# Patient Record
Sex: Female | Born: 1950 | Hispanic: Yes | Marital: Married | Smoking: Never smoker
Health system: Southern US, Community
[De-identification: ages and names within clinical notes are randomized; demographics above are authoritative.]

## PROBLEM LIST (undated history)

## (undated) DIAGNOSIS — I10 Essential (primary) hypertension: Secondary | ICD-10-CM

## (undated) HISTORY — DX: Essential (primary) hypertension: I10

---

## 2003-09-23 ENCOUNTER — Emergency Department (HOSPITAL_COMMUNITY): Admission: EM | Admit: 2003-09-23 | Discharge: 2003-09-23 | Payer: Self-pay | Admitting: Emergency Medicine

## 2005-10-25 IMAGING — CT CT HEAD W/O CM
1 of 2 series · 13 of 30 positions shown, 17 images · non-contrast
Comparison: none

CLINICAL DATA: Headache.  Hypertension.
 CT OF THE HEAD WITHOUT CONTRAST
 Routine unenhanced study was performed.  No comparison.
 There is no evidence of acute intracranial hemorrhage, mass effect, or extra-axial fluid collection.  The ventricles and subarachnoid spaces are appropriately sized for age.  The left frontal and anterior ethmoid sinuses are opacified.  There is also some mucosal thickening in the left division of the sphenoid sinus.  No sinus expansion or destruction is demonstrated.  Calvarium appears unremarkable.
 IMPRESSION
 1. Left ethmoid and frontal sinus disease.
 2. Unremarkable CT of the brain.

[Series 2: brain · axial · 0.47mm/px · z∈[+176,+306]mm · 13 of 28 slices shown, 17 images]
[im 2/28  brain]
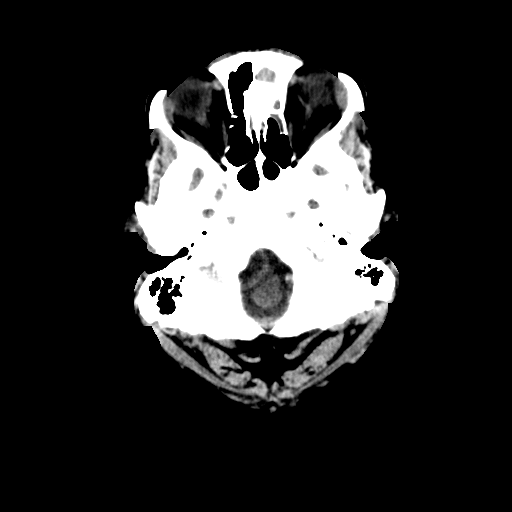
[im 2/28  bone]
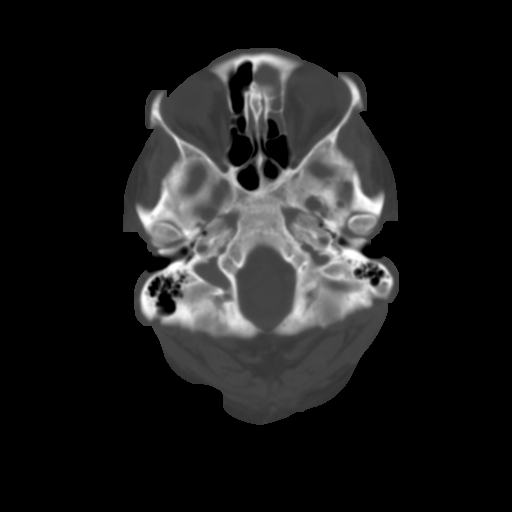
[im 4/28  brain]
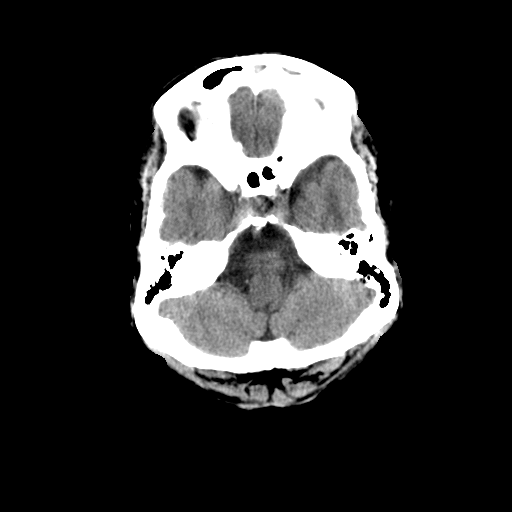
[im 6/28  brain]
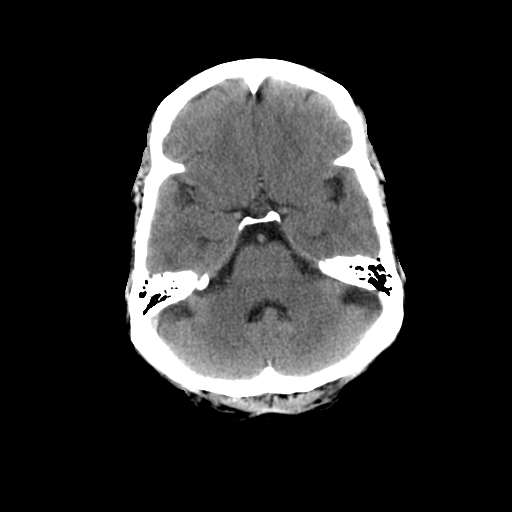
[im 8/28  brain]
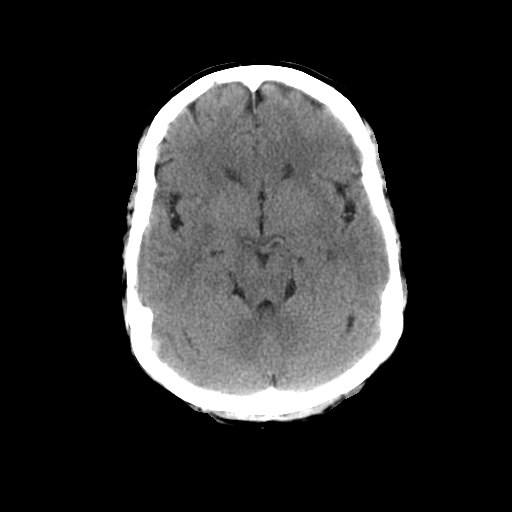
[im 10/28  brain]
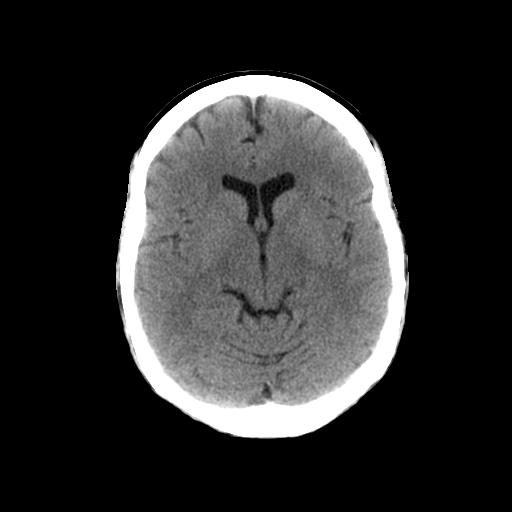
[im 10/28  bone]
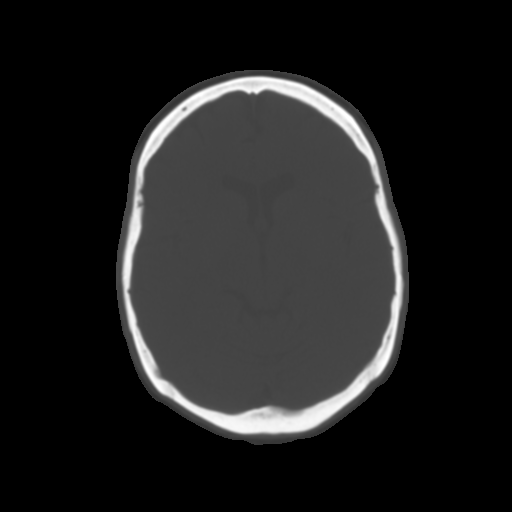
[im 12/28  brain]
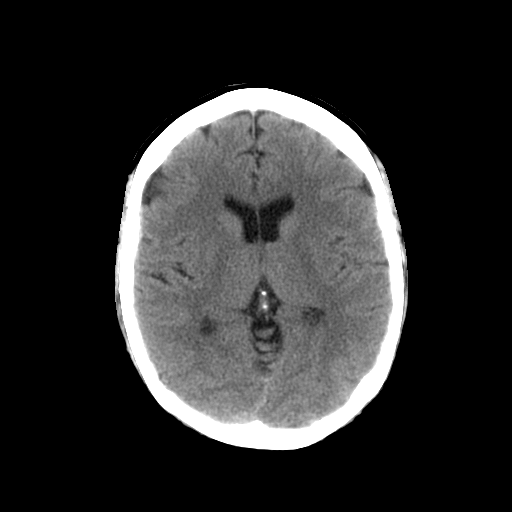
[im 14/28  brain]
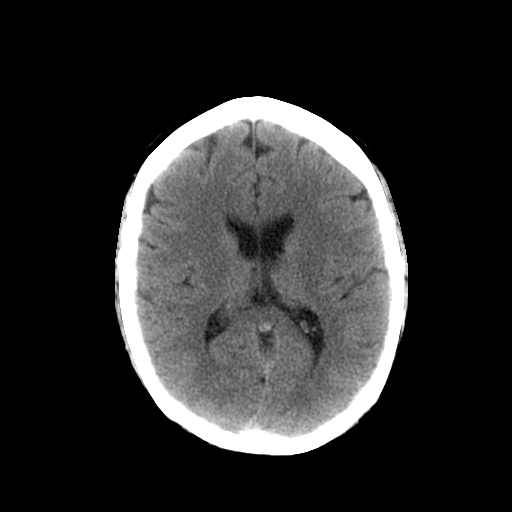
[im 16/28  brain]
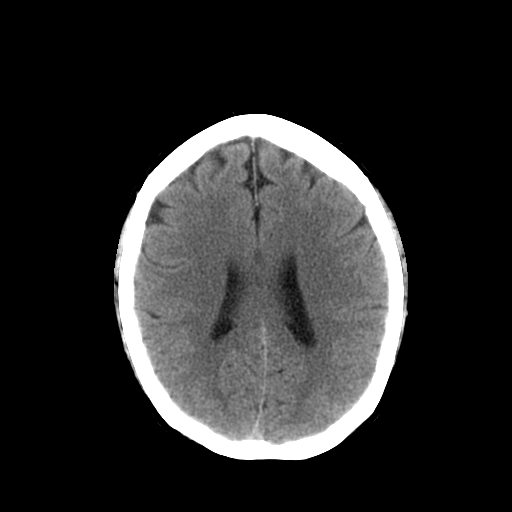
[im 18/28  brain]
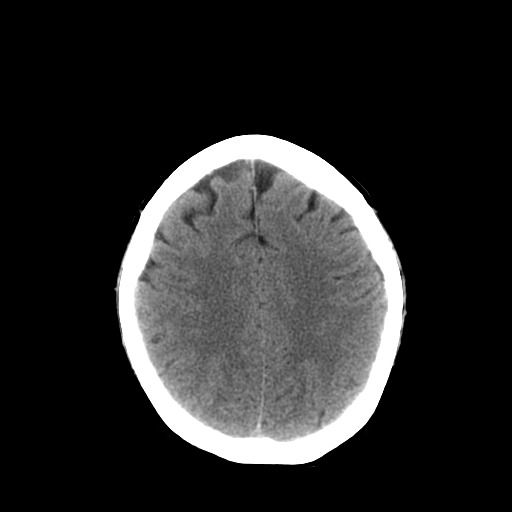
[im 18/28  bone]
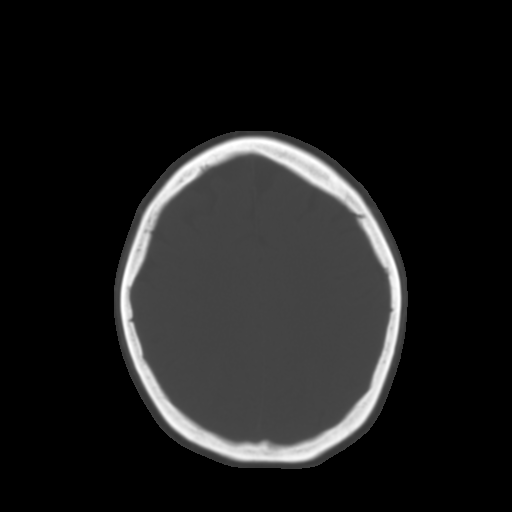
[im 20/28  brain]
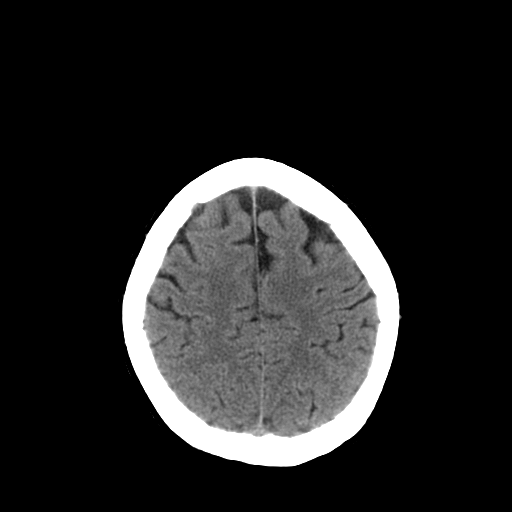
[im 22/28  brain]
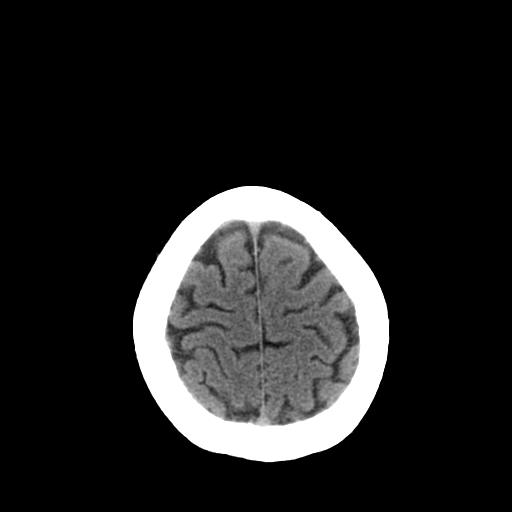
[im 24/28  brain]
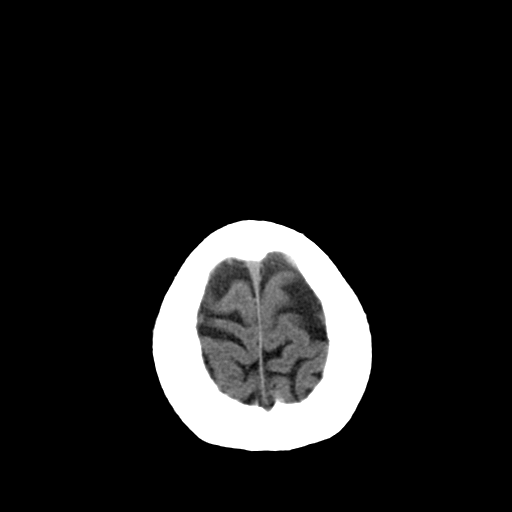
[im 26/28  brain]
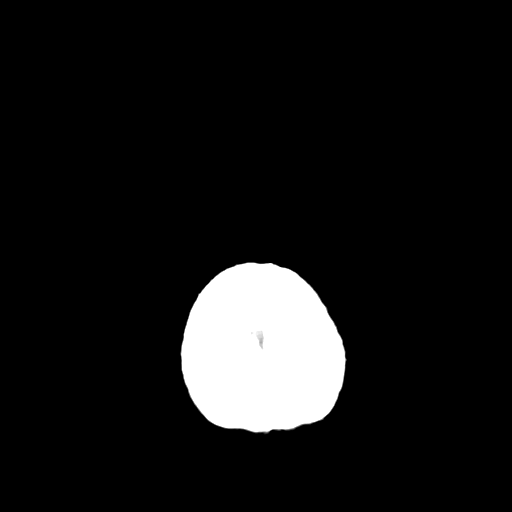
[im 26/28  bone]
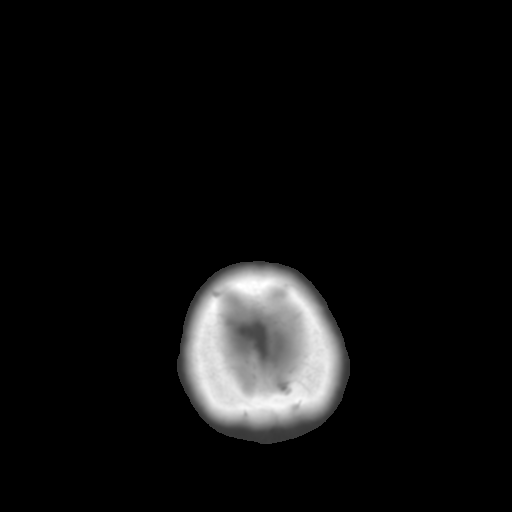

[13 of 30 positions shown; findings below may reference images not displayed]

## 2014-08-08 ENCOUNTER — Ambulatory Visit (INDEPENDENT_AMBULATORY_CARE_PROVIDER_SITE_OTHER): Payer: Self-pay | Admitting: Family Medicine

## 2014-08-08 VITALS — BP 152/64 | HR 45 | Temp 97.5°F | Resp 18 | Ht 58.75 in | Wt 148.8 lb

## 2014-08-08 DIAGNOSIS — I10 Essential (primary) hypertension: Secondary | ICD-10-CM

## 2014-08-08 DIAGNOSIS — M1990 Unspecified osteoarthritis, unspecified site: Secondary | ICD-10-CM

## 2014-08-08 MED ORDER — MELOXICAM 7.5 MG PO TABS: 7.5000 mg | ORAL_TABLET | Freq: Every day | ORAL | Status: AC

## 2014-08-08 MED ORDER — BISOPROLOL-HYDROCHLOROTHIAZIDE 5-6.25 MG PO TABS: 1.0000 | ORAL_TABLET | Freq: Every day | ORAL | Status: AC

## 2014-08-08 NOTE — Progress Notes (Signed)
This a married woman, 64 years old, from [place] who comes in with 1 month of right knee pain. It's given out on her couple times. She is also some pain in her left knee, left flank, and back. She finds that she is walking a little bit differently since she started having knee pain.  Patient's husband was in yesterday and got a shot of cortisone in his knee and is doing much better today.  Objective: Pleasant woman in no acute distress Examination of the joints reveals good range of motion both knees with a small amount of effusion in the right knee and no point tenderness. There is no Baker cyst palpable. She has good range of motion of her shoulders and hips.  After informed consent, the area of the lateral right knee was prepped with Betadine and then 1 mL of Marcaine and 1/2 mL of Depo-Medrol was injected with good pain relief.  This chart was scribed in my presence and reviewed by me personally.    ICD-9-CM ICD-10-CM   1. Arthritis 716.90 M19.90 meloxicam (MOBIC) 7.5 MG tablet  2. Essential hypertension 401.9 I10 bisoprolol-hydrochlorothiazide (ZIAC) 5-6.25 MG per tablet     Signed, Elvina SidleKurt Shamel Germond, MD

## 2014-08-08 NOTE — Patient Instructions (Signed)
Artritis inespecífica °(Arthritis, Nonspecific) °La artritis es la inflamación de una articulación. Los síntomas son dolor, enrojecimiento, calor o hinchazón. Pueden verse involucradas una o más articulaciones. Hay diferentes tipos de artritis. El médico no podrá diagnosticar inmediatamente cuál es el tipo de artritis que usted sufre.  °CAUSAS °La causa más frecuente es el desgaste de la articulación (osteoartritis). Esto ocasiona lesiones en el cartílago, que puede romperse con el tiempo. Las zonas más afectadas por este tipo de artritis son las rodillas, caderas, espalda y cuello. °Otros tipos de artritis y causas frecuentes de dolor en la articulación son: °· Esguinces y otras lesiones cercanas a la articulación}. En algunos casos, esguinces y lesiones menores causan dolor e hinchazón que aparece horas más tarde. °· Artritis reumatoidea Afecta las manos, pies y rodillas. Generalmente afecta ambos lados del cuerpo al mismo tiempo. Generalmente se asocia a enfermedades crónicas, fiebre, pérdida de peso y debilidad general. °· Artritis por cristales. La gota y la pseudogota pueden causar dolor intenso agudo ocasional, enrojecimiento e hinchazón del pie, el tobillo o la rodilla. °· Artritis infecciosa. Las bacterias pueden penetrar en la articulación a través de una herida en la piel. Esto puede causar una infección en la articulación. Las bacterias y virus también pueden diseminarse a través del torrente sanguíneo y afectar las articulaciones. °· Reacciones a medicamentos, infecciosas y alérgicas. En algunos casos las articulaciones duelen levemente y están ligeramente hinchadas en este tipo de enfermedad. °SÍNTOMAS °· El dolor es el síntoma principal. °· La articulación también pueden verse roja, hinchada y caliente al tacto. °· En ciertos tipos de artritis hay fiebre o malestar general. °· En la articulación que presenta artritis sentirá dolor con el movimiento. En otros tipos de artritis hay  rigidez. °DIAGNÓSTICO: °El médico sospechará artritis basándose en la descripción de los síntomas y en el examen. Será necesario realizar pruebas para diagnosticar el tipo de artritis. °· Análisis de sangre y en algunos casos de orina. °· Radiografías y en algunos casos tomografía computada o diagnóstico por imágenes. °· La remoción del líquido de la articulación (artrocentesis) se realiza para controlar la presencia de bacterias, cristales o por otras causas. Su médico (o un especialista) adormecerán la zona de la articulación con un anestésico local y utilizarán una aguja para retirar líquido de la articulación para ser examinado. Este procedimiento es sólo mínimamente molesto. °· Aún con estas pruebas, el médico no podrá decir qué tipo de artritis usted sufre. La consulta con un especialista (reumatólogo) puede ser de utilidad. °TRATAMIENTO °El médico comentará con usted el tratamiento específico para su tipo de artritis. Si el tipo específico no puede determinarse, podrán aplicarse las siguientes recomendaciones generales.  °El tratamiento para el dolor intenso de las articulaciones consiste en: °· Hacer reposo °· Elevar el miembro. °· Podrán prescribirle medicamentos antiinflamatorios (como ibuprofeno). Evite las actividades que aumenten el dolor. °· Sólo tome medicamentos de venta libre o prescriptos para calmar el dolor y las molestias, según las indicaciones de su médico. °· Puede aplicarse compresas frías sobre la articulación dolorida durante 10 a 15 minutos cada hora. Las compresas calientes también pueden ser beneficiosas, pero no las utilice durante la noche. No use compresas calientes sin autorización de su médico, si es diabético. °· Una inyección de corticoides en la articulación artrítica puede ayudar a reducir el dolor y la hinchazón. °· Si una artritis aguda empeora en los siguientes 1 ó 2 días, será necesario descartar una infección. °El tratamiento prolongado implica la modificación de  actividades y del estilo   de vida para reducir el estrés en la articulación. Puede ser necesario que baje de peso. La actividad física es necesaria para nutrir el cartílago de la articulación y eliminar los desechos. Esto ayuda a mantener fuertes los músculos que rodean la articulación. °INSTRUCCIONES PARA EL CUIDADO DOMICILIARIO °· No tome aspirina para aliviar el dolor si se sospecha que sufre gota. Esto eleva los niveles de ácido úrico. °· Solo tome medicamentos que se pueden comprar sin receta o recetados para el dolor, malestar o fiebre, como le indica el médico. °· Haga reposo todo el tiempo que pueda. °· Si la articulación está hinchada, manténgala elevada. °· Utilice muletas si la articulación que le duele está en la pierna. °· Beber abundante cantidad de líquidos será beneficioso para ciertos tipos de artritis. °· Siga las indicaciones del profesional. °· La actividad física regular puede ser beneficiosa, incluyendo las actividades de bajo impacto como: °¨ Natación. °¨ Aquagym. °¨ Andar en bicicleta. °¨ Caminar. °· La rigidez matutina se alivia con una ducha caliente. °· También es beneficioso que realice ejercicios de amplitud de movimiento. °SOLICITE ATENCIÓN MÉDICA SI: °· No se siente mejor o empeora luego de las 24 horas. °· Presenta efectos adversos por los medicamentos y no mejora con el tratamiento. °SOLICITE ATENCIÓN MÉDICA INMEDIATAMENTE SI: °· Tiene fiebre. °· Presenta fiebre o dolor intenso, hinchazón o enrojecimiento. °· Muchas articulaciones están involucradas y están hinchadas y siente dolor. °· Tiene un dolor intenso en la espalda o siente debilidad en las piernas. °· Pierde el control de la vejiga o del intestino. °Document Released: 03/05/2005 Document Revised: 05/28/2011 °[employer]® Patient Information ©2015 [employer]. This information is not intended to replace advice given to you by your health care provider. Make sure you discuss any questions you have with your health care  provider. ° °
